# Patient Record
Sex: Female | Born: 1995 | Hispanic: Yes | Marital: Single | Smoking: Light tobacco smoker
Health system: Southern US, Community
[De-identification: ages and names within clinical notes are randomized; demographics above are authoritative.]

---

## 2014-11-26 ENCOUNTER — Emergency Department (HOSPITAL_COMMUNITY): Payer: Medicaid Other

## 2014-11-26 ENCOUNTER — Observation Stay (HOSPITAL_COMMUNITY): Payer: Medicaid Other

## 2014-11-26 ENCOUNTER — Observation Stay (HOSPITAL_COMMUNITY)
Admission: EM | Admit: 2014-11-26 | Discharge: 2014-11-28 | Disposition: A | Discharge status: Home / Self Care | Payer: Medicaid Other | Attending: General Surgery | Admitting: General Surgery

## 2014-11-26 ENCOUNTER — Encounter (HOSPITAL_COMMUNITY): Payer: Self-pay | Admitting: Emergency Medicine

## 2014-11-26 DIAGNOSIS — S92351A Displaced fracture of fifth metatarsal bone, right foot, initial encounter for closed fracture: Secondary | ICD-10-CM | POA: Diagnosis not present

## 2014-11-26 DIAGNOSIS — S92301A Fracture of unspecified metatarsal bone(s), right foot, initial encounter for closed fracture: Secondary | ICD-10-CM

## 2014-11-26 DIAGNOSIS — S0083XA Contusion of other part of head, initial encounter: Secondary | ICD-10-CM | POA: Diagnosis present

## 2014-11-26 DIAGNOSIS — S02609A Fracture of mandible, unspecified, initial encounter for closed fracture: Secondary | ICD-10-CM

## 2014-11-26 DIAGNOSIS — T1490XA Injury, unspecified, initial encounter: Secondary | ICD-10-CM

## 2014-11-26 DIAGNOSIS — S060X0A Concussion without loss of consciousness, initial encounter: Secondary | ICD-10-CM | POA: Diagnosis not present

## 2014-11-26 DIAGNOSIS — F1210 Cannabis abuse, uncomplicated: Secondary | ICD-10-CM | POA: Insufficient documentation

## 2014-11-26 DIAGNOSIS — S060XAA Concussion with loss of consciousness status unknown, initial encounter: Secondary | ICD-10-CM | POA: Diagnosis present

## 2014-11-26 DIAGNOSIS — Y9289 Other specified places as the place of occurrence of the external cause: Secondary | ICD-10-CM | POA: Insufficient documentation

## 2014-11-26 DIAGNOSIS — S0263XA Fracture of coronoid process of mandible, initial encounter for closed fracture: Principal | ICD-10-CM | POA: Insufficient documentation

## 2014-11-26 DIAGNOSIS — S060X9A Concussion with loss of consciousness of unspecified duration, initial encounter: Secondary | ICD-10-CM | POA: Diagnosis present

## 2014-11-26 LAB — RAPID URINE DRUG SCREEN, HOSP PERFORMED
Amphetamines: NOT DETECTED
BARBITURATES: NOT DETECTED
BENZODIAZEPINES: POSITIVE — AB
Cocaine: NOT DETECTED
Opiates: NOT DETECTED
TETRAHYDROCANNABINOL: POSITIVE — AB

## 2014-11-26 LAB — URINALYSIS, ROUTINE W REFLEX MICROSCOPIC
Bilirubin Urine: NEGATIVE
Glucose, UA: NEGATIVE mg/dL
Hgb urine dipstick: NEGATIVE
Ketones, ur: NEGATIVE mg/dL
LEUKOCYTES UA: NEGATIVE
Nitrite: NEGATIVE
PROTEIN: NEGATIVE mg/dL
SPECIFIC GRAVITY, URINE: 1.015 (ref 1.005–1.030)
UROBILINOGEN UA: 1 mg/dL (ref 0.0–1.0)
pH: 5.5 (ref 5.0–8.0)

## 2014-11-26 LAB — CBC WITH DIFFERENTIAL/PLATELET
BASOS ABS: 0 10*3/uL (ref 0.0–0.1)
BASOS PCT: 0 % (ref 0–1)
Eosinophils Absolute: 0.1 10*3/uL (ref 0.0–0.7)
Eosinophils Relative: 1 % (ref 0–5)
HCT: 42.6 % (ref 36.0–46.0)
Hemoglobin: 14.2 g/dL (ref 12.0–15.0)
Lymphocytes Relative: 30 % (ref 12–46)
Lymphs Abs: 3.2 10*3/uL (ref 0.7–4.0)
MCH: 29.5 pg (ref 26.0–34.0)
MCHC: 33.3 g/dL (ref 30.0–36.0)
MCV: 88.4 fL (ref 78.0–100.0)
Monocytes Absolute: 0.5 10*3/uL (ref 0.1–1.0)
Monocytes Relative: 5 % (ref 3–12)
NEUTROS ABS: 6.9 10*3/uL (ref 1.7–7.7)
Neutrophils Relative %: 64 % (ref 43–77)
PLATELETS: 243 10*3/uL (ref 150–400)
RBC: 4.82 MIL/uL (ref 3.87–5.11)
RDW: 12.8 % (ref 11.5–15.5)
WBC: 10.8 10*3/uL — AB (ref 4.0–10.5)

## 2014-11-26 LAB — COMPREHENSIVE METABOLIC PANEL
ALK PHOS: 77 U/L (ref 38–126)
ALT: 83 U/L — AB (ref 14–54)
ANION GAP: 12 (ref 5–15)
AST: 52 U/L — ABNORMAL HIGH (ref 15–41)
Albumin: 3.6 g/dL (ref 3.5–5.0)
BILIRUBIN TOTAL: 0.7 mg/dL (ref 0.3–1.2)
BUN: 14 mg/dL (ref 6–20)
CALCIUM: 9.3 mg/dL (ref 8.9–10.3)
CO2: 22 mmol/L (ref 22–32)
CREATININE: 0.87 mg/dL (ref 0.44–1.00)
Chloride: 106 mmol/L (ref 101–111)
GFR calc Af Amer: >60 mL/min (ref >60)
GFR calc non Af Amer: >60 mL/min (ref >60)
Glucose, Bld: 124 mg/dL — ABNORMAL HIGH (ref 70–99)
Potassium: 3.8 mmol/L (ref 3.5–5.1)
Sodium: 140 mmol/L (ref 135–145)
TOTAL PROTEIN: 7.1 g/dL (ref 6.5–8.1)

## 2014-11-26 LAB — MRSA PCR SCREENING: MRSA BY PCR: NEGATIVE

## 2014-11-26 LAB — I-STAT BETA HCG BLOOD, ED (MC, WL, AP ONLY)

## 2014-11-26 LAB — ETHANOL

## 2014-11-26 MED ORDER — PANTOPRAZOLE SODIUM 40 MG IV SOLR
40.0000 mg | Freq: Every day | INTRAVENOUS | Status: DC
  Administered 2014-11-27: 40 mg via INTRAVENOUS
  Filled 2014-11-26: qty 40

## 2014-11-26 MED ORDER — ZIPRASIDONE MESYLATE 20 MG IM SOLR
10.0000 mg | Freq: Once | INTRAMUSCULAR | Status: AC
  Administered 2014-11-26: 10 mg via INTRAMUSCULAR

## 2014-11-26 MED ORDER — LORAZEPAM 2 MG/ML IJ SOLN
2.0000 mg | Freq: Once | INTRAMUSCULAR | Status: AC
  Administered 2014-11-26: 2 mg via INTRAMUSCULAR

## 2014-11-26 MED ORDER — ONDANSETRON HCL 4 MG/2ML IJ SOLN
4.0000 mg | Freq: Four times a day (QID) | INTRAMUSCULAR | Status: DC | PRN
  Administered 2014-11-27 (×2): 4 mg via INTRAVENOUS
  Filled 2014-11-26 (×2): qty 2

## 2014-11-26 MED ORDER — DOCUSATE SODIUM 100 MG PO CAPS
100.0000 mg | ORAL_CAPSULE | Freq: Two times a day (BID) | ORAL | Status: DC
  Administered 2014-11-28: 100 mg via ORAL
  Filled 2014-11-26: qty 1

## 2014-11-26 MED ORDER — LORAZEPAM 2 MG/ML IJ SOLN
INTRAMUSCULAR | Status: AC
  Administered 2014-11-26: 2 mg via INTRAMUSCULAR
  Filled 2014-11-26: qty 1

## 2014-11-26 MED ORDER — PANTOPRAZOLE SODIUM 40 MG PO TBEC: 40.0000 mg | DELAYED_RELEASE_TABLET | Freq: Every day | ORAL | Status: DC

## 2014-11-26 MED ORDER — SODIUM CHLORIDE 0.9 % IV BOLUS (SEPSIS)
1000.0000 mL | Freq: Once | INTRAVENOUS | Status: AC
  Administered 2014-11-26: 1000 mL via INTRAVENOUS

## 2014-11-26 MED ORDER — LORAZEPAM 2 MG/ML IJ SOLN: 2.0000 mg | Freq: Once | INTRAMUSCULAR | Status: DC

## 2014-11-26 MED ORDER — ENOXAPARIN SODIUM 40 MG/0.4ML ~~LOC~~ SOLN
40.0000 mg | SUBCUTANEOUS | Status: DC
  Filled 2014-11-26 (×2): qty 0.4

## 2014-11-26 MED ORDER — MORPHINE SULFATE 2 MG/ML IJ SOLN: 2.0000 mg | INTRAMUSCULAR | Status: DC | PRN

## 2014-11-26 MED ORDER — POTASSIUM CHLORIDE IN NACL 20-0.45 MEQ/L-% IV SOLN
INTRAVENOUS | Status: DC
  Administered 2014-11-26: 75 mL/h via INTRAVENOUS
  Administered 2014-11-27: 14:00:00 via INTRAVENOUS
  Administered 2014-11-27 (×2): 75 mL/h via INTRAVENOUS
  Administered 2014-11-28: 06:00:00 via INTRAVENOUS
  Filled 2014-11-26 (×6): qty 1000

## 2014-11-26 MED ORDER — HYDROCODONE-ACETAMINOPHEN 10-325 MG PO TABS
0.5000 | ORAL_TABLET | ORAL | Status: DC | PRN
  Administered 2014-11-26 – 2014-11-28 (×7): 2 via ORAL
  Filled 2014-11-26 (×7): qty 2

## 2014-11-26 MED ORDER — ONDANSETRON HCL 4 MG PO TABS: 4.0000 mg | ORAL_TABLET | Freq: Four times a day (QID) | ORAL | Status: DC | PRN

## 2014-11-26 MED ORDER — IOHEXOL 300 MG/ML  SOLN
100.0000 mL | Freq: Once | INTRAMUSCULAR | Status: AC | PRN
  Administered 2014-11-26: 100 mL via INTRAVENOUS
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
## 2014-11-26 NOTE — ED Notes (Signed)
Pt to CT with CT tech
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
## 2014-11-26 NOTE — ED Notes (Signed)
Pt more alert now. Cooperative at time but also still agitated.

## 2014-11-26 NOTE — ED Notes (Signed)
Lindie SpruceWyatt, Trauma MD at bedside
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
## 2014-11-26 NOTE — Progress Notes (Signed)
Attempted to receive report, awaiting call back from primary RN.

## 2014-11-26 NOTE — ED Notes (Signed)
Aspen collar removed with order from Trauma service.

## 2014-11-26 NOTE — ED Notes (Signed)
Pt returned from radiology and Michael,Trauma PA at bedside
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
## 2014-11-26 NOTE — ED Notes (Signed)
CT called and informed patient ready for transport for scans; CT verbalized delay and will call back when ready

## 2014-11-26 NOTE — Progress Notes (Signed)
Report received from primary RN, awaiting pt arrival.
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
## 2014-11-27 ENCOUNTER — Encounter (HOSPITAL_COMMUNITY): Payer: Self-pay | Admitting: *Deleted

## 2014-11-27 DIAGNOSIS — S02609A Fracture of mandible, unspecified, initial encounter for closed fracture: Secondary | ICD-10-CM | POA: Diagnosis present

## 2014-11-27 DIAGNOSIS — S92351A Displaced fracture of fifth metatarsal bone, right foot, initial encounter for closed fracture: Secondary | ICD-10-CM | POA: Diagnosis present

## 2014-11-27 LAB — BASIC METABOLIC PANEL
ANION GAP: 10 (ref 5–15)
BUN: 7 mg/dL (ref 6–20)
CALCIUM: 8.5 mg/dL — AB (ref 8.9–10.3)
CO2: 21 mmol/L — AB (ref 22–32)
Chloride: 104 mmol/L (ref 101–111)
Creatinine, Ser: 0.54 mg/dL (ref 0.44–1.00)
GFR calc non Af Amer: >60 mL/min (ref >60)
Glucose, Bld: 97 mg/dL (ref 70–99)
Potassium: 3.4 mmol/L — ABNORMAL LOW (ref 3.5–5.1)
Sodium: 135 mmol/L (ref 135–145)

## 2014-11-27 LAB — CBC
HCT: 37.2 % (ref 36.0–46.0)
Hemoglobin: 12.5 g/dL (ref 12.0–15.0)
MCH: 29.7 pg (ref 26.0–34.0)
MCHC: 33.6 g/dL (ref 30.0–36.0)
MCV: 88.4 fL (ref 78.0–100.0)
Platelets: 244 10*3/uL (ref 150–400)
RBC: 4.21 MIL/uL (ref 3.87–5.11)
RDW: 12.8 % (ref 11.5–15.5)
WBC: 7.8 10*3/uL (ref 4.0–10.5)

## 2014-11-27 MED ORDER — KETOROLAC TROMETHAMINE 30 MG/ML IJ SOLN: 30.0000 mg | Freq: Four times a day (QID) | INTRAMUSCULAR | Status: DC | PRN

## 2014-11-27 MED ORDER — SODIUM CHLORIDE 0.9 % IV BOLUS (SEPSIS)
1000.0000 mL | Freq: Once | INTRAVENOUS | Status: AC
  Administered 2014-11-27: 1000 mL via INTRAVENOUS
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
## 2014-11-27 NOTE — Progress Notes (Signed)
UR completed 

## 2014-11-27 NOTE — Progress Notes (Signed)
Interpreter Wyvonnia DuskyGraciela Namihira for [employer]
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
## 2014-11-28 MED ORDER — DOCUSATE SODIUM 100 MG PO CAPS: 100.0000 mg | ORAL_CAPSULE | Freq: Two times a day (BID) | ORAL | Status: AC

## 2014-11-28 MED ORDER — HYDROCODONE-ACETAMINOPHEN 10-325 MG PO TABS: 0.5000 | ORAL_TABLET | ORAL | Status: AC | PRN

## 2014-11-28 MED ORDER — IBUPROFEN 600 MG PO TABS: 600.0000 mg | ORAL_TABLET | Freq: Four times a day (QID) | ORAL | Status: AC | PRN
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
## 2014-11-28 NOTE — Care Management Note (Signed)
Case Management Note  Patient Details  Name: Brenda GardenerDominque Sura MRN: [id number] Date of Birth: 08/27/1995   Expected Discharge Date:  11/28/14               Expected Discharge Plan:  Home/Self Care  In-House Referral:  Financial Counselor, Interpreting Services, Clinical Social Work  Discharge planning Services  CM Consult  Post Acute Care Choice:  Durable Medical Equipment Choice offered to:  Patient  DME Arranged:  3-N-1, Crutches, Walker rolling DME Agency:  [facility]   Status of Service:  Completed, signed off  Medicare Important Message Given:  N/A - LOS <3 / Initial given by admissions    Additional Comments: Pt recommended to have outpatient OT after discharge. [place] [facility] [place] and pt wishes to go there. [facility]ole [facility] [phone]) and she requested copy of face sheet and outpt OT order. Those were faxed to her at 1300pm today [phone]). DME will deliver to patient room before she leaves today.   Azucena KubaBryson, Kerith Sherley Diane, RN 11/28/2014, 1:11 PM

## 2014-11-28 NOTE — Progress Notes (Signed)
Patient received crutches. Patient ready for discharge.
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
## 2014-11-28 NOTE — Progress Notes (Signed)
Orthopedic Tech Progress Note Patient Details:  Alex GardenerDominque Salmela 06/18/1996 [id number]  Ortho Devices Type of Ortho Device: Crutches Ortho Device/Splint Interventions: Application   Darric Plante 11/28/2014, 1:48 PM

## 2014-11-28 NOTE — Progress Notes (Signed)
Discussed discharge summary with patient. Reviewed all medications with patient. Patient received Rx and work note. Patient waiting for ride and ready for discharge once seen by PT/OT.
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
## 2014-11-28 NOTE — Progress Notes (Signed)
OT Cancellation Note  Patient Details Name: Brenda GardenerDominque Oneal MRN: [id number] DOB: 02/06/1996   Cancelled Treatment:    Reason Eval/Treat Not Completed: Other (comment) (Pt with PT.) OT to reattempt as schedule permits.  Pilar GrammesMathews, Alesandro Stueve H 11/28/2014, 1:34 PM
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
